# Patient Record
Sex: Female | Born: 1985 | Race: White | Hispanic: Yes | Marital: Married | Smoking: Never smoker
Health system: Southern US, Community
[De-identification: ages and names within clinical notes are randomized; demographics above are authoritative.]

---
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
## 2007-05-29 ENCOUNTER — Other Ambulatory Visit: Admission: RE | Admit: 2007-05-29 | Discharge: 2007-05-29 | Payer: Self-pay | Admitting: Family Medicine

## 2008-12-20 ENCOUNTER — Emergency Department (HOSPITAL_BASED_OUTPATIENT_CLINIC_OR_DEPARTMENT_OTHER): Admission: EM | Admit: 2008-12-20 | Discharge: 2008-12-20 | Payer: Self-pay | Admitting: Emergency Medicine

## 2008-12-20 ENCOUNTER — Ambulatory Visit: Payer: Self-pay | Admitting: Diagnostic Radiology

## 2009-05-17 ENCOUNTER — Ambulatory Visit: Payer: Self-pay | Admitting: Psychiatry

## 2009-05-17 ENCOUNTER — Inpatient Hospital Stay (HOSPITAL_COMMUNITY): Admission: AD | Admit: 2009-05-17 | Discharge: 2009-05-21 | Payer: Self-pay | Admitting: Psychiatry

## 2009-06-04 ENCOUNTER — Emergency Department (HOSPITAL_BASED_OUTPATIENT_CLINIC_OR_DEPARTMENT_OTHER): Admission: EM | Admit: 2009-06-04 | Discharge: 2009-06-05 | Payer: Self-pay | Admitting: Emergency Medicine

## 2009-06-04 ENCOUNTER — Ambulatory Visit: Payer: Self-pay | Admitting: Diagnostic Radiology

## 2010-05-23 LAB — BASIC METABOLIC PANEL
Calcium: 9.5 mg/dL (ref 8.4–10.5)
Creatinine, Ser: 0.7 mg/dL (ref 0.4–1.2)
GFR calc non Af Amer: >60 mL/min (ref >60)
Glucose, Bld: 87 mg/dL (ref 70–99)
Potassium: 4.3 mEq/L (ref 3.5–5.1)
Sodium: 142 mEq/L (ref 135–145)

## 2010-05-23 LAB — POCT TOXICOLOGY PANEL

## 2010-05-23 LAB — D-DIMER, QUANTITATIVE: D-Dimer, Quant: <0.22 ug/mL-FEU (ref 0.00–0.48)

## 2010-05-23 LAB — CBC
MCHC: 34.2 g/dL (ref 30.0–36.0)
Platelets: 277 10*3/uL (ref 150–400)

## 2010-05-28 LAB — COMPREHENSIVE METABOLIC PANEL
Albumin: 4.3 g/dL (ref 3.5–5.2)
Alkaline Phosphatase: 73 U/L (ref 39–117)
Chloride: 105 mEq/L (ref 96–112)
Sodium: 137 mEq/L (ref 135–145)
Total Bilirubin: 1 mg/dL (ref 0.3–1.2)

## 2010-05-28 LAB — CBC
HCT: 42.5 % (ref 36.0–46.0)
Hemoglobin: 14.5 g/dL (ref 12.0–15.0)
MCHC: 34.2 g/dL (ref 30.0–36.0)
RBC: 4.6 MIL/uL (ref 3.87–5.11)
RDW: 12.6 % (ref 11.5–15.5)

## 2010-05-28 LAB — TSH: TSH: 0.615 u[IU]/mL (ref 0.350–4.500)

## 2010-07-17 NOTE — H&P (Signed)
NAMECHENAE, Michelle Yu              ACCOUNT NO.:  [id number]   MEDICAL RECORD NO.:  [id number]          PATIENT TYPE:  INP   LOCATION:  9170                          FACILITY:  [facility]   PHYSICIAN:  Charles A. Delcambre, MDDATE OF BIRTH:  16-Mar-1985   DATE OF ADMISSION:  09/02/2006  DATE OF DISCHARGE:                              HISTORY & PHYSICAL   This patient is to be directly admitted now for oligohydramnios at 39  weeks and 2 days.  EDC is September 06, 2006.  She had decreased fetal  movement today.  She came in for a biophysical profile 8/10, but AFI was  5.0 cm.  She had a reactive NST.  She is to be admitted now for  induction.   PAST MEDICAL HISTORY:  None.   SURGICAL HISTORY:  None.   MEDICATIONS:  Prenatal vitamins.   ALLERGIES:  No known drug allergies.   SOCIAL HISTORY:  No tobacco, alcohol, or drug use.  She has had multiple  sexual partners in the past; history of STDs unknown.   FAMILY HISTORY:  Father with hypertension, otherwise no other major  illnesses.   REVIEW OF SYSTEMS:  Denies ruptured membranes or bleeding.  She still  has decreased fetal movement.  Occasional contraction, no bleeding, no  loss of fluid.  No shortness of breath, wheezing, diarrhea,  constipation, bleeding, melena, hematochezia, urge, infrequency,  dysuria, incontinence, or hematuria.  She was recently treated for a  yeast infection with Terazol 3.   PHYSICAL EXAMINATION:  GENERAL:  Alert and oriented x3.  No distress.  VITAL SIGNS:  Blood pressure 122/80, respirations 18, pulse 80,  afebrile.  HEENT:  Grossly within normal limits.  NECK:  Supple, without thyromegaly or adenopathy.  CHEST:  Clear bilaterally.  HEART:  Regular rate and rhythm.  BREASTS:  No masses, tenderness or discharge.  Skin has no changes  bilaterally.  ABDOMEN:  Gravid, fundal height 36 cm.  Estimated fetal weight 3200 g.  PELVIC:  Cervix posterior, soft, and closed.  Either yeast or Terazol is  present in the  vagina, no pooling.  Fluid seen to do a Nitrazine test as  noted.  EXTREMITIES:  Mild edema, symmetrical bilaterally.   ASSESSMENT:  1. Intrauterine pregnancy 39 weeks three days.  2. Oligohydramnios.   LABS:  Blood type O positive antibody screen negative.  VDRL  nonreactive, rubella immune.  Hepatitis B surface antigen negative.  TSH  normal.  Pap negative.  Quad screen normal.  One-hour Glucola 122,  hemoglobin 13.1 at 28 weeks.  RPR at 36 weeks nonreactive.  HIV at 36  weeks nonreactive.  GC and chlamydia at 36 weeks negative.  Group B  strep was negative.   PLAN:  Admit.  She may have supper; and if she is not in labor may  shower and eat breakfast in the morning.  We will place a Cervidil.  Once she is able to be admitted; otherwise, keep her on the monitor  while waiting; continuous fetal monitoring.  If possible, later on in  the morning, continue with Cervidil.  All questions were answered.  She  is vertex on ultrasound as well.  We will draw a CBC and an RPR on  admission.  All questions were answered; and she will proceed to the  hospital at this time.      Charles A. Sydnee Cabal, MD  Electronically Signed     CAD/MEDQ  D:  09/02/2006  T:  09/02/2006  Job:  [id number]

## 2010-12-18 LAB — CBC
HCT: 33.7 — ABNORMAL LOW
HCT: 39.3
Hemoglobin: 11.8 — ABNORMAL LOW
Hemoglobin: 13.6
MCHC: 34.5
MCV: 92.2
Platelets: 272
RBC: 3.69 — ABNORMAL LOW
RBC: 4.26
RDW: 13.4
WBC: 10.2
WBC: 13.2 — ABNORMAL HIGH

## 2010-12-18 LAB — CCBB MATERNAL DONOR DRAW

## 2013-11-09 ENCOUNTER — Encounter (HOSPITAL_BASED_OUTPATIENT_CLINIC_OR_DEPARTMENT_OTHER): Payer: Self-pay | Admitting: Emergency Medicine

## 2013-11-09 ENCOUNTER — Emergency Department (HOSPITAL_BASED_OUTPATIENT_CLINIC_OR_DEPARTMENT_OTHER)
Admission: EM | Admit: 2013-11-09 | Discharge: 2013-11-09 | Disposition: A | Discharge status: Home / Self Care | Payer: No Typology Code available for payment source | Attending: Emergency Medicine | Admitting: Emergency Medicine

## 2013-11-09 ENCOUNTER — Emergency Department (HOSPITAL_BASED_OUTPATIENT_CLINIC_OR_DEPARTMENT_OTHER): Payer: No Typology Code available for payment source

## 2013-11-09 DIAGNOSIS — S0993XA Unspecified injury of face, initial encounter: Secondary | ICD-10-CM | POA: Diagnosis present

## 2013-11-09 DIAGNOSIS — Y92410 Unspecified street and highway as the place of occurrence of the external cause: Secondary | ICD-10-CM | POA: Insufficient documentation

## 2013-11-09 DIAGNOSIS — S239XXA Sprain of unspecified parts of thorax, initial encounter: Secondary | ICD-10-CM | POA: Diagnosis not present

## 2013-11-09 DIAGNOSIS — S139XXA Sprain of joints and ligaments of unspecified parts of neck, initial encounter: Secondary | ICD-10-CM | POA: Insufficient documentation

## 2013-11-09 DIAGNOSIS — Y9389 Activity, other specified: Secondary | ICD-10-CM | POA: Diagnosis not present

## 2013-11-09 DIAGNOSIS — S199XXA Unspecified injury of neck, initial encounter: Secondary | ICD-10-CM

## 2013-11-09 DIAGNOSIS — IMO0002 Reserved for concepts with insufficient information to code with codable children: Secondary | ICD-10-CM | POA: Diagnosis not present

## 2013-11-09 DIAGNOSIS — S161XXA Strain of muscle, fascia and tendon at neck level, initial encounter: Secondary | ICD-10-CM

## 2013-11-09 DIAGNOSIS — S29019A Strain of muscle and tendon of unspecified wall of thorax, initial encounter: Secondary | ICD-10-CM

## 2013-11-09 MED ORDER — CYCLOBENZAPRINE HCL 10 MG PO TABS: 10.0000 mg | ORAL_TABLET | Freq: Three times a day (TID) | ORAL | Status: DC | PRN
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
## 2013-11-09 NOTE — ED Notes (Signed)
MD at bedside. 
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
## 2015-06-20 ENCOUNTER — Emergency Department (HOSPITAL_BASED_OUTPATIENT_CLINIC_OR_DEPARTMENT_OTHER)
Admission: EM | Admit: 2015-06-20 | Discharge: 2015-06-20 | Disposition: A | Discharge status: Home / Self Care | Payer: Medicaid Other | Attending: Emergency Medicine | Admitting: Emergency Medicine

## 2015-06-20 ENCOUNTER — Encounter (HOSPITAL_BASED_OUTPATIENT_CLINIC_OR_DEPARTMENT_OTHER): Payer: Self-pay

## 2015-06-20 DIAGNOSIS — K529 Noninfective gastroenteritis and colitis, unspecified: Secondary | ICD-10-CM | POA: Insufficient documentation

## 2015-06-20 DIAGNOSIS — M791 Myalgia: Secondary | ICD-10-CM | POA: Insufficient documentation

## 2015-06-20 DIAGNOSIS — R112 Nausea with vomiting, unspecified: Secondary | ICD-10-CM | POA: Diagnosis present

## 2015-06-20 LAB — CBC WITH DIFFERENTIAL/PLATELET
BASOS PCT: 0 %
Basophils Absolute: 0 10*3/uL (ref 0.0–0.1)
EOS PCT: 0 %
Eosinophils Absolute: 0 10*3/uL (ref 0.0–0.7)
HEMATOCRIT: 44.1 % (ref 36.0–46.0)
Hemoglobin: 16 g/dL — ABNORMAL HIGH (ref 12.0–15.0)
LYMPHS PCT: 3 %
Lymphs Abs: 0.6 10*3/uL — ABNORMAL LOW (ref 0.7–4.0)
MCH: 30.9 pg (ref 26.0–34.0)
MCHC: 36.3 g/dL — AB (ref 30.0–36.0)
MCV: 85.1 fL (ref 78.0–100.0)
MONO ABS: 0.4 10*3/uL (ref 0.1–1.0)
MONOS PCT: 2 %
NEUTROS ABS: 16.5 10*3/uL — AB (ref 1.7–7.7)
Neutrophils Relative %: 95 %
Platelets: 346 10*3/uL (ref 150–400)
RBC: 5.18 MIL/uL — ABNORMAL HIGH (ref 3.87–5.11)
RDW: 12.6 % (ref 11.5–15.5)
WBC: 17.5 10*3/uL — ABNORMAL HIGH (ref 4.0–10.5)

## 2015-06-20 LAB — PREGNANCY, URINE: PREG TEST UR: NEGATIVE

## 2015-06-20 LAB — COMPREHENSIVE METABOLIC PANEL
ALT: 23 U/L (ref 14–54)
ANION GAP: 9 (ref 5–15)
AST: 26 U/L (ref 15–41)
Albumin: 4.8 g/dL (ref 3.5–5.0)
Alkaline Phosphatase: 73 U/L (ref 38–126)
BILIRUBIN TOTAL: 1 mg/dL (ref 0.3–1.2)
BUN: 13 mg/dL (ref 6–20)
CO2: 24 mmol/L (ref 22–32)
Calcium: 9.5 mg/dL (ref 8.9–10.3)
Chloride: 105 mmol/L (ref 101–111)
Creatinine, Ser: 0.65 mg/dL (ref 0.44–1.00)
GFR calc Af Amer: >60 mL/min (ref >60)
Glucose, Bld: 126 mg/dL — ABNORMAL HIGH (ref 65–99)
POTASSIUM: 3.9 mmol/L (ref 3.5–5.1)
Sodium: 138 mmol/L (ref 135–145)
TOTAL PROTEIN: 9 g/dL — AB (ref 6.5–8.1)

## 2015-06-20 LAB — LIPASE, BLOOD: LIPASE: 17 U/L (ref 11–51)

## 2015-06-20 MED ORDER — LOPERAMIDE HCL 2 MG PO TABS: 2.0000 mg | ORAL_TABLET | Freq: Four times a day (QID) | ORAL | Status: DC | PRN

## 2015-06-20 MED ORDER — ONDANSETRON HCL 4 MG/2ML IJ SOLN
4.0000 mg | Freq: Once | INTRAMUSCULAR | Status: AC
  Administered 2015-06-20: 4 mg via INTRAVENOUS
  Filled 2015-06-20: qty 2

## 2015-06-20 MED ORDER — SODIUM CHLORIDE 0.9 % IV BOLUS (SEPSIS)
1000.0000 mL | Freq: Once | INTRAVENOUS | Status: AC
  Administered 2015-06-20: 1000 mL via INTRAVENOUS

## 2015-06-20 MED ORDER — HYDROMORPHONE HCL 1 MG/ML IJ SOLN
1.0000 mg | Freq: Once | INTRAMUSCULAR | Status: AC
  Administered 2015-06-20: 1 mg via INTRAVENOUS
  Filled 2015-06-20: qty 1

## 2015-06-20 MED ORDER — ONDANSETRON 4 MG PO TBDP: 4.0000 mg | ORAL_TABLET | Freq: Three times a day (TID) | ORAL | Status: DC | PRN

## 2015-06-20 MED ORDER — ONDANSETRON HCL 4 MG/2ML IJ SOLN: 4.0000 mg | Freq: Once | INTRAMUSCULAR | Status: DC

## 2015-06-20 MED ORDER — SODIUM CHLORIDE 0.9 % IV SOLN: INTRAVENOUS | Status: DC

## 2015-06-20 NOTE — ED Notes (Signed)
MD at bedside. 

## 2015-06-20 NOTE — ED Notes (Signed)
Pt tolerating ice chips.

## 2015-06-20 NOTE — ED Provider Notes (Signed)
CSN: [id number]     Arrival date & time 06/20/15  1838 History   First MD Initiated Contact with Patient 06/20/15 1907     Chief Complaint  Patient presents with  . Emesis     (Consider location/radiation/quality/duration/timing/severity/associated sxs/prior Treatment) Patient is a 30 y.o. female presenting with vomiting. The history is provided by the patient.  Emesis Associated symptoms: diarrhea and myalgias   Associated symptoms: no abdominal pain and no headaches   Patient awoke at 8:30 this morning feeling dizzy no true vertigo. By 10:00 in the morning she was vomiting. Vomited 7-8 times. No blood loose diarrhea-type bowel movements 4-5. Associated with bodyaches no abdominal pain. No fevers. No known sick contacts.  History reviewed. No pertinent past medical history. History reviewed. No pertinent past surgical history. No family history on file. Social History  Substance Use Topics  . Smoking status: Never Smoker   . Smokeless tobacco: None  . Alcohol Use: No   OB History    No data available     Review of Systems  Constitutional: Negative for fever.  HENT: Negative for congestion.   Eyes: Negative for redness.  Respiratory: Negative for shortness of breath.   Cardiovascular: Negative for chest pain.  Gastrointestinal: Positive for nausea, vomiting and diarrhea. Negative for abdominal pain and blood in stool.  Genitourinary: Negative for dysuria.  Musculoskeletal: Positive for myalgias.  Skin: Negative for rash.  Neurological: Negative for headaches.  Hematological: Does not bruise/bleed easily.  Psychiatric/Behavioral: Negative for confusion.      Allergies  Review of patient's allergies indicates no known allergies.  Home Medications   Prior to Admission medications   Medication Sig Start Date End Date Taking? Authorizing Provider  loperamide (IMODIUM A-D) 2 MG tablet Take 1 tablet (2 mg total) by mouth 4 (four) times daily as needed for diarrhea or  loose stools. 06/20/15   Vanetta Mulders, MD  ondansetron (ZOFRAN ODT) 4 MG disintegrating tablet Take 1 tablet (4 mg total) by mouth every 8 (eight) hours as needed for nausea or vomiting. 06/20/15   Vanetta Mulders, MD   BP 99/66 mmHg  Pulse 102  Temp(Src) 98.1 F (36.7 C) (Oral)  Resp 16  Ht  (1.626 m)  Wt 68.04 kg  BMI 25.73 kg/m2  SpO2 100%  LMP 05/20/2015 Physical Exam  Constitutional: She is oriented to person, place, and time. She appears well-developed and well-nourished. No distress.  HENT:  Head: Normocephalic and atraumatic.  Mucous membranes slightly dry.  Eyes: Conjunctivae are normal. Pupils are equal, round, and reactive to light.  Neck: Normal range of motion. Neck supple.  Cardiovascular: Normal rate, regular rhythm and normal heart sounds.   No murmur heard. Pulmonary/Chest: Effort normal and breath sounds normal. No respiratory distress.  Abdominal: Soft. Bowel sounds are normal. She exhibits no distension. There is no tenderness. There is no rebound and no guarding.  Musculoskeletal: Normal range of motion. She exhibits no edema.  Neurological: She is alert and oriented to person, place, and time. No cranial nerve deficit. She exhibits normal muscle tone. Coordination normal.  Skin: Skin is warm. No rash noted.  Nursing note and vitals reviewed.   ED Course  Procedures (including critical care time) Labs Review Labs Reviewed  CBC WITH DIFFERENTIAL/PLATELET - Abnormal; Notable for the following:    WBC 17.5 (*)    RBC 5.18 (*)    Hemoglobin 16.0 (*)    MCHC 36.3 (*)    Neutro Abs 16.5 (*)  Lymphs Abs 0.6 (*)    All other components within normal limits  COMPREHENSIVE METABOLIC PANEL - Abnormal; Notable for the following:    Glucose, Bld 126 (*)    Total Protein 9.0 (*)    All other components within normal limits  LIPASE, BLOOD  PREGNANCY, URINE   Results for orders placed or performed during the hospital encounter of 06/20/15  CBC with  Differential  Result Value Ref Range   WBC 17.5 (H) 4.0 - 10.5 K/uL   RBC 5.18 (H) 3.87 - 5.11 MIL/uL   Hemoglobin 16.0 (H) 12.0 - 15.0 g/dL   HCT 09.844.1 11.936.0 - 14.746.0 %   MCV 85.1 78.0 - 100.0 fL   MCH 30.9 26.0 - 34.0 pg   MCHC 36.3 (H) 30.0 - 36.0 g/dL   RDW 82.912.6 56.211.5 - 13.015.5 %   Platelets 346 150 - 400 K/uL   Neutrophils Relative % 95 %   Neutro Abs 16.5 (H) 1.7 - 7.7 K/uL   Lymphocytes Relative 3 %   Lymphs Abs 0.6 (L) 0.7 - 4.0 K/uL   Monocytes Relative 2 %   Monocytes Absolute 0.4 0.1 - 1.0 K/uL   Eosinophils Relative 0 %   Eosinophils Absolute 0.0 0.0 - 0.7 K/uL   Basophils Relative 0 %   Basophils Absolute 0.0 0.0 - 0.1 K/uL  Comprehensive metabolic panel  Result Value Ref Range   Sodium 138 135 - 145 mmol/L   Potassium 3.9 3.5 - 5.1 mmol/L   Chloride 105 101 - 111 mmol/L   CO2 24 22 - 32 mmol/L   Glucose, Bld 126 (H) 65 - 99 mg/dL   BUN 13 6 - 20 mg/dL   Creatinine, Ser 8.650.65 0.44 - 1.00 mg/dL   Calcium 9.5 8.9 - 78.410.3 mg/dL   Total Protein 9.0 (H) 6.5 - 8.1 g/dL   Albumin 4.8 3.5 - 5.0 g/dL   AST 26 15 - 41 U/L   ALT 23 14 - 54 U/L   Alkaline Phosphatase 73 38 - 126 U/L   Total Bilirubin 1.0 0.3 - 1.2 mg/dL   GFR calc non Af Amer >60 >60 mL/min   GFR calc Af Amer >60 >60 mL/min   Anion gap 9 5 - 15  Lipase, blood  Result Value Ref Range   Lipase 17 11 - 51 U/L  Pregnancy, urine  Result Value Ref Range   Preg Test, Ur NEGATIVE NEGATIVE    Imaging Review No results found. I have personally reviewed and evaluated these images and lab results as part of my medical decision-making.   EKG Interpretation None      MDM   Final diagnoses:  Gastroenteritis    The patient without any significant abdominal pain. Abdomen is soft and nontender. Symptoms seem to be consistent with a gastroenteritis. Not completely ruled out the possibility of food poisoning. No significant fevers. Patient overall feels better with fluid hydration antinausea medicine. Patient will  be discharged home on Zofran and Imodium right ear. Patient will return if not improving in the next 1-2 days. Work note provided. No concerns for an acute abdominal process. See test negative. Liver function tests negative.    Vanetta MuldersScott Rosaleah Person, MD 06/20/15 2250

## 2015-06-20 NOTE — ED Notes (Signed)
Pt asking for ice chips, ok per Dr Deretha EmoryZackowski

## 2015-06-20 NOTE — ED Notes (Signed)
Pt became very warm and nauseated with administration of dilaudid.  Pt put on 2L nasal canula and practiced breathing through nose with nurse.  Color has returned to normal and pt states she feels better.

## 2015-06-20 NOTE — ED Notes (Signed)
N/v/chills, body aches, SOB-started this am-NAD-slow steady gait

## 2015-06-20 NOTE — ED Notes (Signed)
Patient states that she is only nauseated when she looks at the lights. She is drinking fluids without N/v and tolerating well. Zofran not given.

## 2015-06-20 NOTE — Discharge Instructions (Signed)
Food Choices to Help Relieve Diarrhea, Adult °When you have diarrhea, the foods you eat and your eating habits are very important. Choosing the right foods and drinks can help relieve diarrhea. Also, because diarrhea can last up to 7 days, you need to replace lost fluids and electrolytes (such as sodium, potassium, and chloride) in order to help prevent dehydration.  °WHAT GENERAL GUIDELINES DO I NEED TO FOLLOW? °· Slowly drink 1 cup (8 oz) of fluid for each episode of diarrhea. If you are getting enough fluid, your urine will be clear or pale yellow. °· Eat starchy foods. Some good choices include white rice, white toast, pasta, low-fiber cereal, baked potatoes (without the skin), saltine crackers, and bagels. °· Avoid large servings of any cooked vegetables. °· Limit fruit to two servings per day. A serving is ½ cup or 1 small piece. °· Choose foods with less than 2 g of fiber per serving. °· Limit fats to less than 8 tsp (38 g) per day. °· Avoid fried foods. °· Eat foods that have probiotics in them. Probiotics can be found in certain dairy products. °· Avoid foods and beverages that may increase the speed at which food moves through the stomach and intestines (gastrointestinal tract). Things to avoid include: °¨ High-fiber foods, such as dried fruit, raw fruits and vegetables, nuts, seeds, and whole grain foods. °¨ Spicy foods and high-fat foods. °¨ Foods and beverages sweetened with high-fructose corn syrup, honey, or sugar alcohols such as xylitol, sorbitol, and mannitol. °WHAT FOODS ARE RECOMMENDED? °Grains °White rice. White, French, or pita breads (fresh or toasted), including plain rolls, buns, or bagels. White pasta. Saltine, soda, or graham crackers. Pretzels. Low-fiber cereal. Cooked cereals made with water (such as cornmeal, farina, or cream cereals). Plain muffins. Matzo. Melba toast. Zwieback.  °Vegetables °Potatoes (without the skin). Strained tomato and vegetable juices. Most well-cooked and canned  vegetables without seeds. Tender lettuce. °Fruits °Cooked or canned applesauce, apricots, cherries, fruit cocktail, grapefruit, peaches, pears, or plums. Fresh bananas, apples without skin, cherries, grapes, cantaloupe, grapefruit, peaches, oranges, or plums.  °Meat and Other Protein Products °Baked or boiled chicken. Eggs. Tofu. Fish. Seafood. Smooth peanut butter. Ground or well-cooked tender beef, ham, veal, lamb, pork, or poultry.  °Dairy °Plain yogurt, kefir, and unsweetened liquid yogurt. Lactose-free milk, buttermilk, or soy milk. Plain hard cheese. °Beverages °Sport drinks. Clear broths. Diluted fruit juices (except prune). Regular, caffeine-free sodas such as ginger ale. Water. Decaffeinated teas. Oral rehydration solutions. Sugar-free beverages not sweetened with sugar alcohols. °Other °Bouillon, broth, or soups made from recommended foods.  °The items listed above may not be a complete list of recommended foods or beverages. Contact your dietitian for more options. °WHAT FOODS ARE NOT RECOMMENDED? °Grains °Whole grain, whole wheat, bran, or rye breads, rolls, pastas, crackers, and cereals. Wild or brown rice. Cereals that contain more than 2 g of fiber per serving. Corn tortillas or taco shells. Cooked or dry oatmeal. Granola. Popcorn. °Vegetables °Raw vegetables. Cabbage, broccoli, Brussels sprouts, artichokes, baked beans, beet greens, corn, kale, legumes, peas, sweet potatoes, and yams. Potato skins. Cooked spinach and cabbage. °Fruits °Dried fruit, including raisins and dates. Raw fruits. Stewed or dried prunes. Fresh apples with skin, apricots, mangoes, pears, raspberries, and strawberries.  °Meat and Other Protein Products °Chunky peanut butter. Nuts and seeds. Beans and lentils. Bacon.  °Dairy °High-fat cheeses. Milk, chocolate milk, and beverages made with milk, such as milk shakes. Cream. Ice cream. °Sweets and Desserts °Sweet rolls, doughnuts, and sweet breads.   Pancakes and waffles. Fats and  Oils Butter. Cream sauces. Margarine. Salad oils. Plain salad dressings. Olives. Avocados.  Beverages Caffeinated beverages (such as coffee, tea, soda, or energy drinks). Alcoholic beverages. Fruit juices with pulp. Prune juice. Soft drinks sweetened with high-fructose corn syrup or sugar alcohols. Other Coconut. Hot sauce. Chili powder. Mayonnaise. Gravy. Cream-based or milk-based soups.  The items listed above may not be a complete list of foods and beverages to avoid. Contact your dietitian for more information. WHAT SHOULD I DO IF I BECOME DEHYDRATED? Diarrhea can sometimes lead to dehydration. Signs of dehydration include dark urine and dry mouth and skin. If you think you are dehydrated, you should rehydrate with an oral rehydration solution. These solutions can be purchased at pharmacies, retail stores, or online.  Drink -1 cup (120-240 mL) of oral rehydration solution each time you have an episode of diarrhea. If drinking this amount makes your diarrhea worse, try drinking smaller amounts more often. For example, drink 1-3 tsp (5-15 mL) every 5-10 minutes.  A general rule for staying hydrated is to drink 1-2 L of fluid per day. Talk to your health care provider about the specific amount you should be drinking each day. Drink enough fluids to keep your urine clear or pale yellow.     This information is not intended to replace advice given to you by your health care provider. Make sure you discuss any questions you have with your health care provider.  I take the Zofran as needed for nausea and vomiting. Take Imodium right ear as needed for diarrhea. Would expect to be improving in the 24-48 hours. Return for any new or worse symptoms. Work note provided.     Document Released: 05/11/2003 Document Revised: 03/11/2014 Document Reviewed: 01/11/2013 [employer] Interactive Patient Education [employer]2016 [employer]

## 2016-01-23 ENCOUNTER — Emergency Department (HOSPITAL_BASED_OUTPATIENT_CLINIC_OR_DEPARTMENT_OTHER)
Admission: EM | Admit: 2016-01-23 | Discharge: 2016-01-23 | Disposition: A | Discharge status: Home / Self Care | Payer: Medicaid Other | Attending: Emergency Medicine | Admitting: Emergency Medicine

## 2016-01-23 ENCOUNTER — Encounter (HOSPITAL_BASED_OUTPATIENT_CLINIC_OR_DEPARTMENT_OTHER): Payer: Self-pay | Admitting: Emergency Medicine

## 2016-01-23 DIAGNOSIS — Z79899 Other long term (current) drug therapy: Secondary | ICD-10-CM | POA: Insufficient documentation

## 2016-01-23 DIAGNOSIS — R112 Nausea with vomiting, unspecified: Secondary | ICD-10-CM | POA: Insufficient documentation

## 2016-01-23 DIAGNOSIS — R197 Diarrhea, unspecified: Secondary | ICD-10-CM | POA: Insufficient documentation

## 2016-01-23 LAB — CBC WITH DIFFERENTIAL/PLATELET
BASOS ABS: 0 10*3/uL (ref 0.0–0.1)
BASOS PCT: 0 %
EOS ABS: 0.1 10*3/uL (ref 0.0–0.7)
Eosinophils Relative: 1 %
HCT: 37.3 % (ref 36.0–46.0)
HEMOGLOBIN: 13.4 g/dL (ref 12.0–15.0)
LYMPHS ABS: 2.4 10*3/uL (ref 0.7–4.0)
Lymphocytes Relative: 27 %
MCH: 31.2 pg (ref 26.0–34.0)
MCHC: 35.9 g/dL (ref 30.0–36.0)
MCV: 86.7 fL (ref 78.0–100.0)
Monocytes Absolute: 0.6 10*3/uL (ref 0.1–1.0)
Monocytes Relative: 7 %
NEUTROS PCT: 65 %
Neutro Abs: 5.9 10*3/uL (ref 1.7–7.7)
Platelets: 309 10*3/uL (ref 150–400)
RBC: 4.3 MIL/uL (ref 3.87–5.11)
RDW: 12.5 % (ref 11.5–15.5)
WBC: 8.9 10*3/uL (ref 4.0–10.5)

## 2016-01-23 LAB — URINE MICROSCOPIC-ADD ON

## 2016-01-23 LAB — COMPREHENSIVE METABOLIC PANEL
ALK PHOS: 53 U/L (ref 38–126)
ALT: 20 U/L (ref 14–54)
ANION GAP: 7 (ref 5–15)
AST: 23 U/L (ref 15–41)
Albumin: 4.1 g/dL (ref 3.5–5.0)
BUN: 9 mg/dL (ref 6–20)
CALCIUM: 8.9 mg/dL (ref 8.9–10.3)
CO2: 23 mmol/L (ref 22–32)
Chloride: 104 mmol/L (ref 101–111)
Creatinine, Ser: 0.47 mg/dL (ref 0.44–1.00)
Glucose, Bld: 87 mg/dL (ref 65–99)
Potassium: 3.6 mmol/L (ref 3.5–5.1)
SODIUM: 134 mmol/L — AB (ref 135–145)
Total Bilirubin: 0.8 mg/dL (ref 0.3–1.2)
Total Protein: 7.4 g/dL (ref 6.5–8.1)

## 2016-01-23 LAB — LIPASE, BLOOD: LIPASE: 13 U/L (ref 11–51)

## 2016-01-23 LAB — URINALYSIS, ROUTINE W REFLEX MICROSCOPIC
Bilirubin Urine: NEGATIVE
Glucose, UA: NEGATIVE mg/dL
Hgb urine dipstick: NEGATIVE
KETONES UR: NEGATIVE mg/dL
NITRITE: NEGATIVE
PH: 8 (ref 5.0–8.0)
Protein, ur: NEGATIVE mg/dL
SPECIFIC GRAVITY, URINE: 1.016 (ref 1.005–1.030)

## 2016-01-23 LAB — PREGNANCY, URINE: Preg Test, Ur: NEGATIVE

## 2016-01-23 MED ORDER — DICYCLOMINE HCL 10 MG PO CAPS
ORAL_CAPSULE | ORAL | Status: AC
  Administered 2016-01-23: 10 mg via ORAL
  Filled 2016-01-23: qty 2

## 2016-01-23 MED ORDER — ONDANSETRON 4 MG PO TBDP
4.0000 mg | ORAL_TABLET | Freq: Once | ORAL | Status: DC
  Filled 2016-01-23: qty 1

## 2016-01-23 MED ORDER — LOPERAMIDE HCL 2 MG PO TABS: 2.0000 mg | ORAL_TABLET | Freq: Four times a day (QID) | ORAL | 0 refills | Status: AC | PRN

## 2016-01-23 MED ORDER — DICYCLOMINE HCL 10 MG/ML IM SOLN
20.0000 mg | Freq: Once | INTRAMUSCULAR | Status: DC
  Filled 2016-01-23: qty 2

## 2016-01-23 MED ORDER — DICYCLOMINE HCL 10 MG PO CAPS
10.0000 mg | ORAL_CAPSULE | Freq: Once | ORAL | Status: AC
  Administered 2016-01-23: 10 mg via ORAL

## 2016-01-23 MED ORDER — DICYCLOMINE HCL 20 MG PO TABS: 20.0000 mg | ORAL_TABLET | Freq: Two times a day (BID) | ORAL | 0 refills | Status: AC

## 2016-01-23 MED ORDER — ONDANSETRON 4 MG PO TBDP
4.0000 mg | ORAL_TABLET | Freq: Once | ORAL | Status: AC | PRN
  Administered 2016-01-23: 4 mg via ORAL
  Filled 2016-01-23: qty 1

## 2016-01-23 MED ORDER — ONDANSETRON 4 MG PO TBDP: 4.0000 mg | ORAL_TABLET | Freq: Three times a day (TID) | ORAL | 1 refills | Status: AC | PRN

## 2016-01-23 NOTE — ED Triage Notes (Addendum)
Vomiting and diarrhea since 10pm following eating chinese food. No vomiting or diarrhea since 7 this morning, still feeling nauseated.

## 2016-01-23 NOTE — ED Provider Notes (Signed)
[facility] Provider Note   CSN: [id number] Arrival date & time: 01/23/16  1156     History   Chief Complaint Chief Complaint  Patient presents with  . Emesis    HPI Michelle Yu is a 30 y.o. female who presents with n/v/d. Patient had onset of sx 2 hours after eating chinese food last night. She had several episodes of n/v nbnb emesis and brown watery stools. She is concerned for food poisoning. Her sxs have resolved. She denies contacts with similar sxs,  recent foreign travel . Has had some increased urinary frequency. Denies fevers, chills, myalgias, arthralgias. Denies DOE, SOB, chest tightness or pressure, radiation to left arm, jaw or back, or diaphoresis. Denies dysuria, flank pain, suprapubic pain,  urgency, or hematuria. Denies headaches, light headedness, weakness, visual disturbances.    HPI  History reviewed. No pertinent past medical history.  There are no active problems to display for this patient.   History reviewed. No pertinent surgical history.  OB History    No data available       Home Medications    Prior to Admission medications   Medication Sig Start Date End Date Taking? Authorizing Provider  loperamide (IMODIUM A-D) 2 MG tablet Take 1 tablet (2 mg total) by mouth 4 (four) times daily as needed for diarrhea or loose stools. 06/20/15   Vanetta MuldersScott Zackowski, MD  ondansetron (ZOFRAN ODT) 4 MG disintegrating tablet Take 1 tablet (4 mg total) by mouth every 8 (eight) hours as needed for nausea or vomiting. 06/20/15   Vanetta MuldersScott Zackowski, MD    Family History No family history on file.  Social History Social History  Substance Use Topics  . Smoking status: Never Smoker  . Smokeless tobacco: Never Used  . Alcohol use Yes     Allergies   Patient has no known allergies.   Review of Systems Review of Systems  Ten systems reviewed and are negative for acute change, except as noted in the HPI.   Physical Exam Updated Vital Signs BP  123/90 (BP Location: Right Arm)   Pulse 95   Temp 98.9 F (37.2 C) (Oral)   Resp 18   Ht 5\' 4"  (1.626 m)   Wt 65.8 kg   LMP 01/21/2016   SpO2 99%   BMI 24.89 kg/m   Physical Exam  Constitutional: She is oriented to person, place, and time. She appears well-developed and well-nourished. No distress.  HENT:  Head: Normocephalic and atraumatic.  Eyes: Conjunctivae are normal. No scleral icterus.  Neck: Normal range of motion.  Cardiovascular: Normal rate, regular rhythm and normal heart sounds.  Exam reveals no gallop and no friction rub.   No murmur heard. Pulmonary/Chest: Effort normal and breath sounds normal. No respiratory distress.  Abdominal: Soft. Bowel sounds are normal. She exhibits no distension and no mass. There is no tenderness. There is no guarding.  Diffuse mild tenderness   Neurological: She is alert and oriented to person, place, and time.  Skin: Skin is warm and dry. She is not diaphoretic.     ED Treatments / Results  Labs (all labs ordered are listed, but only abnormal results are displayed) Labs Reviewed  URINALYSIS, ROUTINE W REFLEX [facility]CROSCOPIC ([facility]) - Abnormal; Notable for the following:       Result Value   Leukocytes, UA LARGE (*)    All other components within normal limits  URINE MICROSCOPIC-ADD ON - Abnormal; Notable for the following:    Squamous Epithelial / LPF  0-5 (*)    Bacteria, UA FEW (*)    All other components within normal limits  PREGNANCY, URINE    EKG  EKG Interpretation None       Radiology No results found.  Procedures Procedures (including critical care time)  Medications Ordered in ED Medications  ondansetron (ZOFRAN-ODT) disintegrating tablet 4 mg (4 mg Oral Given 01/23/16 1224)     Initial Impression / Assessment and Plan / ED Course  I have reviewed the triage vital signs and the nursing notes.  Pertinent labs & imaging results that were available during my care of the patient were reviewed by me  and considered in my medical decision making (see chart for details).  Clinical Course as of Jan 22 1645  Tue Jan 23, 2016  1255 Will send UA for culture Leukocytes, UA: (!) LARGE [AH]  1255 Preg Test, Ur: NEGATIVE [AH]    Clinical Course User Index [AH] Arthor CaptainAbigail Sarayah Bacchi, PA-C    Patient with symptoms consistent with viral gastroenteritis.  Vitals are stable, no fever.  No signs of dehydration, tolerating PO fluids > 6 oz.  Lungs are clear.  No focal abdominal pain, no concern for appendicitis, cholecystitis, pancreatitis, ruptured viscus, UTI, kidney stone, or any other abdominal etiology.  Supportive therapy indicated with return if symptoms worsen.  Patient counseled.   Final Clinical Impressions(s) / ED Diagnoses   Final diagnoses:  None    New Prescriptions New Prescriptions   No medications on file     Arthor Captainbigail Jaci Desanto, PA-C 01/23/16 1646    Loren Raceravid Yelverton, MD 01/26/16 [phone]

## 2016-01-23 NOTE — ED Notes (Signed)
Pt able to sip gingerale without further vomiting.

## 2016-01-25 LAB — URINE CULTURE

## 2018-12-28 ENCOUNTER — Emergency Department (HOSPITAL_BASED_OUTPATIENT_CLINIC_OR_DEPARTMENT_OTHER)
Admission: EM | Admit: 2018-12-28 | Discharge: 2018-12-28 | Disposition: A | Discharge status: Home / Self Care | Payer: PRIVATE HEALTH INSURANCE | Attending: Emergency Medicine | Admitting: Emergency Medicine

## 2018-12-28 ENCOUNTER — Other Ambulatory Visit: Payer: Self-pay

## 2018-12-28 ENCOUNTER — Emergency Department (HOSPITAL_BASED_OUTPATIENT_CLINIC_OR_DEPARTMENT_OTHER): Payer: PRIVATE HEALTH INSURANCE

## 2018-12-28 ENCOUNTER — Encounter (HOSPITAL_BASED_OUTPATIENT_CLINIC_OR_DEPARTMENT_OTHER): Payer: Self-pay

## 2018-12-28 DIAGNOSIS — O418X10 Other specified disorders of amniotic fluid and membranes, first trimester, not applicable or unspecified: Secondary | ICD-10-CM | POA: Insufficient documentation

## 2018-12-28 DIAGNOSIS — Z3A10 10 weeks gestation of pregnancy: Secondary | ICD-10-CM | POA: Insufficient documentation

## 2018-12-28 DIAGNOSIS — O26891 Other specified pregnancy related conditions, first trimester: Secondary | ICD-10-CM | POA: Diagnosis not present

## 2018-12-28 DIAGNOSIS — O4691 Antepartum hemorrhage, unspecified, first trimester: Secondary | ICD-10-CM | POA: Diagnosis present

## 2018-12-28 DIAGNOSIS — O208 Other hemorrhage in early pregnancy: Secondary | ICD-10-CM | POA: Diagnosis not present

## 2018-12-28 DIAGNOSIS — O468X1 Other antepartum hemorrhage, first trimester: Secondary | ICD-10-CM

## 2018-12-28 DIAGNOSIS — R1030 Lower abdominal pain, unspecified: Secondary | ICD-10-CM | POA: Insufficient documentation

## 2018-12-28 DIAGNOSIS — O4690 Antepartum hemorrhage, unspecified, unspecified trimester: Secondary | ICD-10-CM

## 2018-12-28 DIAGNOSIS — Z79899 Other long term (current) drug therapy: Secondary | ICD-10-CM | POA: Insufficient documentation

## 2018-12-28 LAB — CBC WITH DIFFERENTIAL/PLATELET
Abs Immature Granulocytes: 0.04 10*3/uL (ref 0.00–0.07)
Basophils Absolute: 0 10*3/uL (ref 0.0–0.1)
Basophils Relative: 0 %
Eosinophils Absolute: 0.1 10*3/uL (ref 0.0–0.5)
Eosinophils Relative: 1 %
HCT: 45 % (ref 36.0–46.0)
Hemoglobin: 15.4 g/dL — ABNORMAL HIGH (ref 12.0–15.0)
Immature Granulocytes: 0 %
Lymphocytes Relative: 25 %
Lymphs Abs: 2.9 10*3/uL (ref 0.7–4.0)
MCH: 30.9 pg (ref 26.0–34.0)
MCHC: 34.2 g/dL (ref 30.0–36.0)
MCV: 90.2 fL (ref 80.0–100.0)
Monocytes Absolute: 0.7 10*3/uL (ref 0.1–1.0)
Monocytes Relative: 6 %
Neutro Abs: 7.6 10*3/uL (ref 1.7–7.7)
Neutrophils Relative %: 68 %
Platelets: 346 10*3/uL (ref 150–400)
RBC: 4.99 MIL/uL (ref 3.87–5.11)
RDW: 13.1 % (ref 11.5–15.5)
WBC: 11.3 10*3/uL — ABNORMAL HIGH (ref 4.0–10.5)
nRBC: 0 % (ref 0.0–0.2)

## 2018-12-28 LAB — URINALYSIS, ROUTINE W REFLEX MICROSCOPIC
Bilirubin Urine: NEGATIVE
Glucose, UA: NEGATIVE mg/dL
Hgb urine dipstick: NEGATIVE
Ketones, ur: NEGATIVE mg/dL
Leukocytes,Ua: NEGATIVE
Nitrite: NEGATIVE
Protein, ur: NEGATIVE mg/dL
Specific Gravity, Urine: <1.005 — ABNORMAL LOW (ref 1.005–1.030)
pH: 7 (ref 5.0–8.0)

## 2018-12-28 LAB — HCG, QUANTITATIVE, PREGNANCY: hCG, Beta Chain, Quant, S: 84248 m[IU]/mL — ABNORMAL HIGH (ref <5)

## 2018-12-28 LAB — WET PREP, GENITAL
Sperm: NONE SEEN
Trich, Wet Prep: NONE SEEN
Yeast Wet Prep HPF POC: NONE SEEN

## 2018-12-28 LAB — ABO/RH: ABO/RH(D): O POS

## 2018-12-28 NOTE — ED Notes (Signed)
Pt provided snack and warm blankets. No complaints at this time.

## 2018-12-28 NOTE — ED Provider Notes (Signed)
[facility] EMERGENCY DEPARTMENT Provider Note   CSN: [id number] Arrival date & time: 12/28/18  1600     History   Chief Complaint Chief Complaint  Patient presents with  . Vaginal Bleeding    HPI Michelle Yu is a 33 y.o. female.     HPI   Pt is a 33 y/o female that is currently about 10.[redacted] weeks pregnant who presents to the ED today for evaluation of vaginal bleeding.  States she woke up this morning and had significant vaginal bleeding.  She states later the bleeding eased up and she only had spotting and since then the bleeding has stopped altogether.  She is complaining of some lower abdominal cramping.  Denies severe abdominal pain.  She has had some nausea and intermittent vomiting throughout the beginning of her pregnancy.  Denies any fevers.  Denies any urinary symptoms.  Denies any abnormal vaginal discharge.  Low concern for STD.  Is okay with testing for GC/chlamydia but defers HIV/RPR testing at this time.  History reviewed. No pertinent past medical history.  There are no active problems to display for this patient.   History reviewed. No pertinent surgical history.   OB History    Gravida  1   Para      Term      Preterm      AB      Living        SAB      TAB      Ectopic      Multiple      Live Births               Home Medications    Prior to Admission medications   Medication Sig Start Date End Date Taking? Authorizing Provider  dicyclomine (BENTYL) 20 MG tablet Take 1 tablet (20 mg total) by mouth 2 (two) times daily. 01/23/16   Arthor CaptainHarris, Abigail, PA-C  loperamide (IMODIUM A-D) 2 MG tablet Take 1 tablet (2 mg total) by mouth 4 (four) times daily as needed for diarrhea or loose stools. 01/23/16   Harris, Abigail, PA-C  ondansetron (ZOFRAN ODT) 4 MG disintegrating tablet Take 1 tablet (4 mg total) by mouth every 8 (eight) hours as needed for nausea or vomiting. 01/23/16   Arthor CaptainHarris, Abigail, PA-C    Family History No  family history on file.  Social History Social History   Tobacco Use  . Smoking status: Never Smoker  . Smokeless tobacco: Never Used  Substance Use Topics  . Alcohol use: Not Currently  . Drug use: No     Allergies   Patient has no known allergies.   Review of Systems Review of Systems  Constitutional: Negative for fever.  HENT: Negative for ear pain and sore throat.   Eyes: Negative for visual disturbance.  Respiratory: Negative for cough and shortness of breath.   Cardiovascular: Negative for chest pain.  Gastrointestinal: Positive for abdominal pain (cramping). Negative for diarrhea, nausea and vomiting.  Genitourinary: Positive for pelvic pain (cramping) and vaginal bleeding. Negative for decreased urine volume, dysuria, hematuria and vaginal discharge.  Musculoskeletal: Negative for back pain.  Skin: Negative for color change and rash.  Neurological: Negative for headaches.  All other systems reviewed and are negative.    Physical Exam Updated Vital Signs BP 126/78   Pulse 79   Temp 98.3 F (36.8 C) (Oral)   Resp 19   Ht 5\' 4"  (1.626 m)   Wt 80.3 kg   SpO2 100%  BMI 30.38 kg/m   Physical Exam Vitals signs and nursing note reviewed.  Constitutional:      General: She is not in acute distress.    Appearance: She is well-developed.  HENT:     Head: Normocephalic and atraumatic.  Eyes:     Conjunctiva/sclera: Conjunctivae normal.  Neck:     Musculoskeletal: Neck supple.  Cardiovascular:     Rate and Rhythm: Normal rate and regular rhythm.     Heart sounds: No murmur.  Pulmonary:     Effort: Pulmonary effort is normal. No respiratory distress.     Breath sounds: Normal breath sounds.  Abdominal:     Palpations: Abdomen is soft.     Comments: Mild suprapubic ttp without guarding  Genitourinary:    Comments: Exam performed by Rodney Booze,  exam chaperoned Date: 12/28/2018 Pelvic exam: normal external genitalia without evidence of trauma.  VULVA: normal appearing vulva with no masses, tenderness or lesion. VAGINA: normal appearing vagina with normal color and, no lesions. Mild white discharge. CERVIX: overall normal appearing cervix, mild bleeding during the exam which appears to originate from the outer cervix, cervical motion tenderness absent, cervical os closed with out purulent discharge; vaginal discharge - mild white discharge, Wet prep and DNA probe for chlamydia and GC obtained.   ADNEXA: normal adnexa in size, nontender and no masses UTERUS: uterus is normal size, shape, consistency and mildly tender  Skin:    General: Skin is warm and dry.  Neurological:     Mental Status: She is alert.      ED Treatments / Results  Labs (all labs ordered are listed, but only abnormal results are displayed) Labs Reviewed  WET PREP, GENITAL - Abnormal; Notable for the following components:      Result Value   Clue Cells Wet Prep HPF POC PRESENT (*)    WBC, Wet Prep HPF POC MANY (*)    All other components within normal limits  CBC WITH DIFFERENTIAL/PLATELET - Abnormal; Notable for the following components:   WBC 11.3 (*)    Hemoglobin 15.4 (*)    All other components within normal limits  HCG, QUANTITATIVE, PREGNANCY - Abnormal; Notable for the following components:   hCG, Beta Chain, Quant, S 84,248 (*)    All other components within normal limits  URINALYSIS, ROUTINE W REFLEX MICROSCOPIC - Abnormal; Notable for the following components:   Specific Gravity, Urine <1.005 (*)    All other components within normal limits  ABO/RH  GC/CHLAMYDIA PROBE AMP ([place]) NOT AT [facility]    EKG None  Radiology US Ob Comp < 14 Wks  Result Date: 12/28/2018 CLINICAL DATA:  33 year old pregnant female with vaginal bleeding. LMP: 10/15/2018 corresponding to an estimated gestational age of [redacted] weeks, 4 days. EXAM: OBSTETRIC <14 WK ULTRASOUND TECHNIQUE: Transabdominal ultrasound was performed for evaluation of the gestation as well  as the maternal uterus and adnexal regions. COMPARISON:  None. FINDINGS: Intrauterine gestational sac: Single intrauterine gestational sac. Yolk sac:  Seen Embryo:  Present Cardiac Activity: Detected Heart Rate: 169 bpm CRL:   38 mm   10 w 5 d                  [place] EDC: 07/21/2019 Subchorionic hemorrhage: Subchorionic hemorrhage measures approximately 2.8 x 0.7 cm. Maternal uterus/adnexae: The maternal ovaries are unremarkable. IMPRESSION: Single live intrauterine pregnancy with an estimated gestational age of [redacted] weeks, 5 days. Electronically Signed   By: Anner Crete M.D.   On:  12/28/2018 19:09    Procedures Procedures (including critical care time)  Medications Ordered in ED Medications - No data to display   Initial Impression / Assessment and Plan / ED Course  I have reviewed the triage vital signs and the nursing notes.  Pertinent labs & imaging results that were available during my care of the patient were reviewed by me and considered in my medical decision making (see chart for details).   Final Clinical Impressions(s) / ED Diagnoses   Final diagnoses:  Vaginal bleeding in pregnancy  Subchorionic hematoma in first trimester, single or unspecified fetus   33 year old female that is currently 10 weeks 5 days pregnant presenting for evaluation of vaginal bleeding and abdominal cramping starting this morning.  Denies concern for STD.  No urinary complaints or fevers.  Patient does have minimal bleeding on exam with no CMT or adnexal tenderness.  CBC with mild leukocytosis, no anemia Wet prep with clue cells and WBCs, patient asymptomatic will defer treatment. Beta-hCG elevated at 84,000, no prior for comparison UA without evidence for UTI GC chlamydia pending ABO Rh with O+ blood, no need for RhoGam  Pelvic ultrasound with subchorionic hemorrhage.  Single live intrauterine pregnancy noted.  Patient made aware of results and need for follow-up with OB/GYN this week or early  next week for repeat beta-hCG.  Advised on specific return precautions.  She voices understanding of plan and reasons to return.  All questions answered.  Patient stable for discharge.  ED Discharge Orders    None       Rayne Du 12/28/18 2259    Alvira Monday, MD 12/29/18 1258

## 2018-12-28 NOTE — Discharge Instructions (Signed)
Please make an appointment with your OB/GYN later this week or early next week for reevaluation.    Please return to the emergency department for any new or worsening symptoms including severe abdominal pain, fevers, heavy bleeding, or any other concerns.

## 2018-12-28 NOTE — ED Notes (Signed)
Patient transported to Ultrasound 

## 2018-12-28 NOTE — ED Triage Notes (Signed)
Pt c/o vaginal bleeding started 9am-pad count x 4-reports 1pad/hour-pt states she is [redacted] weeks pregnant-[facility]-states she called x 4 with no return call per pt-NAD-steady gait

## 2018-12-28 NOTE — ED Notes (Signed)
Lab informed of lab

## 2018-12-30 LAB — GC/CHLAMYDIA PROBE AMP (~~LOC~~) NOT AT ARMC
Chlamydia: NEGATIVE
Neisseria Gonorrhea: NEGATIVE
